# Patient Record
Sex: Male | Born: 1970 | Race: White | Hispanic: No | Marital: Married | Smoking: Current every day smoker
Health system: Southern US, Community
[De-identification: ages and names within clinical notes are randomized; demographics above are authoritative.]

## PROBLEM LIST (undated history)

## (undated) DIAGNOSIS — R51 Headache: Secondary | ICD-10-CM

## (undated) DIAGNOSIS — R519 Headache, unspecified: Secondary | ICD-10-CM

## (undated) HISTORY — DX: Headache, unspecified: R51.9

## (undated) HISTORY — DX: Headache: R51

## (undated) HISTORY — PX: ADENOIDECTOMY: SUR15

---

## 1999-01-08 ENCOUNTER — Encounter: Payer: Self-pay | Admitting: *Deleted

## 1999-01-08 ENCOUNTER — Emergency Department (HOSPITAL_COMMUNITY): Admission: EM | Admit: 1999-01-08 | Discharge: 1999-01-08 | Payer: Self-pay | Admitting: *Deleted

## 1999-04-18 ENCOUNTER — Emergency Department (HOSPITAL_COMMUNITY): Admission: EM | Admit: 1999-04-18 | Discharge: 1999-04-18 | Payer: Self-pay | Admitting: Emergency Medicine

## 2003-03-26 ENCOUNTER — Encounter: Admission: RE | Admit: 2003-03-26 | Discharge: 2003-03-26 | Payer: Self-pay | Admitting: Neurosurgery

## 2003-03-26 ENCOUNTER — Encounter: Payer: Self-pay | Admitting: Neurosurgery

## 2003-05-07 ENCOUNTER — Encounter: Admission: RE | Admit: 2003-05-07 | Discharge: 2003-05-07 | Payer: Self-pay | Admitting: Neurosurgery

## 2003-07-16 ENCOUNTER — Encounter: Admission: RE | Admit: 2003-07-16 | Discharge: 2003-07-16 | Payer: Self-pay | Admitting: Neurosurgery

## 2004-10-08 ENCOUNTER — Emergency Department (HOSPITAL_COMMUNITY): Admission: EM | Admit: 2004-10-08 | Discharge: 2004-10-08 | Payer: Self-pay | Admitting: Emergency Medicine

## 2008-08-09 ENCOUNTER — Emergency Department (HOSPITAL_COMMUNITY): Admission: EM | Admit: 2008-08-09 | Discharge: 2008-08-09 | Payer: Self-pay | Admitting: Emergency Medicine

## 2011-09-03 ENCOUNTER — Ambulatory Visit: Payer: Self-pay | Admitting: Internal Medicine

## 2011-09-03 DIAGNOSIS — H53149 Visual discomfort, unspecified: Secondary | ICD-10-CM

## 2011-09-03 DIAGNOSIS — R110 Nausea: Secondary | ICD-10-CM

## 2011-09-03 DIAGNOSIS — G43909 Migraine, unspecified, not intractable, without status migrainosus: Secondary | ICD-10-CM | POA: Insufficient documentation

## 2011-09-03 DIAGNOSIS — G4700 Insomnia, unspecified: Secondary | ICD-10-CM

## 2011-09-03 DIAGNOSIS — R1110 Vomiting, unspecified: Secondary | ICD-10-CM

## 2011-09-03 DIAGNOSIS — R51 Headache: Secondary | ICD-10-CM

## 2011-09-03 MED ORDER — HYDROCODONE-ACETAMINOPHEN 5-500 MG PO TABS: 1.0000 | ORAL_TABLET | Freq: Three times a day (TID) | ORAL | Status: AC | PRN

## 2011-09-03 MED ORDER — PROMETHAZINE HCL 25 MG PO TABS: 25.0000 mg | ORAL_TABLET | Freq: Three times a day (TID) | ORAL | Status: AC | PRN

## 2011-09-03 MED ORDER — AMITRIPTYLINE HCL 25 MG PO TABS: 25.0000 mg | ORAL_TABLET | Freq: Every day | ORAL | Status: AC

## 2011-09-03 NOTE — Progress Notes (Signed)
  Subjective:    Patient ID: Andrew Butler, male    DOB: [birth year], 41 y.o.   MRN: [id number]  HPI C/O HA off and on for 10d. Phx of migrains, never imaged, w/up done with FP in past. This attacked hit suddenly, had an aura, was photo and phono phobic. Vomited 3x. Lots of family stress, single father of 55 y/o son, working too much, not sleeping well. No focal NMS loss  Review of Systems Smoker DX of previous migrains    Objective:   Physical Exam  Constitutional: He is oriented to person, place, and time. He appears well-developed and well-nourished. No distress.  Eyes: EOM are normal. Pupils are equal, round, and reactive to light.  Neck: Normal range of motion. Neck supple.  Cardiovascular: Normal rate.   Pulmonary/Chest: Effort normal.  Neurological: He is alert and oriented to person, place, and time. He has normal strength and normal reflexes. He is not disoriented. He displays normal reflexes. No cranial nerve deficit or sensory deficit. He displays a negative Romberg sign. Coordination and gait normal. He displays no Babinski's sign on the right side. He displays no Babinski's sign on the left side.        Excellent balance No drift  Fully alert   Skin: Skin is warm and dry.  Psychiatric: He has a normal mood and affect.          Assessment & Plan:   Migrain Vomiting  No insurance but agrees to CT scan if not rapid improvement Keep HA diary  Amitriptyline 25mg  hs Phenergan 25mg  prn Alive 440 prn Vicodin 5/500 prn  Close f/up with FP

## 2011-09-03 NOTE — Patient Instructions (Signed)

## 2012-09-09 ENCOUNTER — Ambulatory Visit (HOSPITAL_BASED_OUTPATIENT_CLINIC_OR_DEPARTMENT_OTHER): Payer: BC Managed Care – PPO

## 2014-07-08 ENCOUNTER — Ambulatory Visit (INDEPENDENT_AMBULATORY_CARE_PROVIDER_SITE_OTHER): Payer: BLUE CROSS/BLUE SHIELD

## 2014-07-08 ENCOUNTER — Ambulatory Visit (INDEPENDENT_AMBULATORY_CARE_PROVIDER_SITE_OTHER): Payer: BLUE CROSS/BLUE SHIELD | Admitting: Family Medicine

## 2014-07-08 VITALS — BP 144/98 | HR 84 | Temp 98.3°F | Resp 18 | Ht 70.0 in | Wt 216.0 lb

## 2014-07-08 DIAGNOSIS — R030 Elevated blood-pressure reading, without diagnosis of hypertension: Secondary | ICD-10-CM

## 2014-07-08 DIAGNOSIS — G44209 Tension-type headache, unspecified, not intractable: Secondary | ICD-10-CM

## 2014-07-08 DIAGNOSIS — Z872 Personal history of diseases of the skin and subcutaneous tissue: Secondary | ICD-10-CM

## 2014-07-08 DIAGNOSIS — F17200 Nicotine dependence, unspecified, uncomplicated: Secondary | ICD-10-CM

## 2014-07-08 DIAGNOSIS — Z720 Tobacco use: Secondary | ICD-10-CM

## 2014-07-08 DIAGNOSIS — R079 Chest pain, unspecified: Secondary | ICD-10-CM

## 2014-07-08 DIAGNOSIS — R5383 Other fatigue: Secondary | ICD-10-CM

## 2014-07-08 LAB — POCT URINALYSIS DIPSTICK
Bilirubin, UA: NEGATIVE
Blood, UA: NEGATIVE
Glucose, UA: NEGATIVE
KETONES UA: NEGATIVE
Leukocytes, UA: NEGATIVE
NITRITE UA: NEGATIVE
PROTEIN UA: NEGATIVE
Spec Grav, UA: >=1.03
Urobilinogen, UA: 0.2
pH, UA: 5.5

## 2014-07-08 MED ORDER — ASPIRIN 81 MG PO TABS: 81.0000 mg | ORAL_TABLET | Freq: Every day | ORAL | Status: DC

## 2014-07-08 MED ORDER — METOPROLOL TARTRATE 25 MG PO TABS: 25.0000 mg | ORAL_TABLET | Freq: Two times a day (BID) | ORAL | Status: DC

## 2014-07-08 MED ORDER — IBUPROFEN 600 MG PO TABS: 600.0000 mg | ORAL_TABLET | Freq: Three times a day (TID) | ORAL | Status: DC | PRN

## 2014-07-08 NOTE — Progress Notes (Signed)
MRN: [id number] DOB: [birth year]  Subjective:   Andrew Butler is a 44 y.o. male presenting for 1 day history of chest pain. Chest pain is sharp in nature, comes in transient waves lasting less than 5 seconds, occuring once every 20 minutes, felt about ~30 times in the last day, does not radiate. Symptoms initially started out with malaise, heart racing, palpitations and intermittent left jaw pain 4 days ago, developed headache 2 days ago on son's birthday, felt in neck radiating toward temporal head bilaterally, has been using Goody powder multiple times a day without any relief. Also admits a several week history of shob with strenuous activity at work, several month history of intermittent dizziness lasting a few seconds. Denies diaphoresis, arm pain, pain in neck, radiation of pain toward back, n/v, abdominal pain. Denies history of cardiovascular diagnoses. Of note, patient admits increased stress and anxiety in the past months (especially this week), does maintenance work Web designer(redoing rental houses), has to meet time demands with work, does not have adequate support, works alone, is very strenuous work. Also of note, girlfriend states that he snores, holds his breath, has nightime awakenings wherein he is gasping for air. Diet is "bad", does not have an exercise routine, states he is active at work. Has had intermittent numbness and tingling for months in both hands and both feet. Admits smoking ~1 ppd 15+ years, would like to quit, drinks 6-8 beers about one a week, used to drink much more. Denies any other aggravating or relieving factors, no other questions or concerns.  Andrew LericheMark has a current medication list which includes the following prescription(s): omeprazole.  He has No Known Allergies.  Andrew LericheMark  has a past medical history of HA (headache). Also  has no past surgical history on file.   Andrew Butler's family history includes Cancer in his father; Diabetes in his mother; Heart disease in his father; Mental  illness in his sister.  ROS As in subjective.  Objective:   Vitals: BP 144/98 mmHg  Pulse 84  Temp(Src) 98.3 F (36.8 C) (Oral)  Resp 18  Ht 5\' 10"  (1.778 m)  Wt 216 lb (97.977 kg)  BMI 30.99 kg/m2  SpO2 97%   Physical Exam  Constitutional: He is oriented to person, place, and time and well-developed, well-nourished, and in no distress.  Eyes: Conjunctivae and EOM are normal. Right eye exhibits no discharge. Left eye exhibits no discharge. No scleral icterus.  Neck: Normal range of motion. No thyromegaly present.  Cardiovascular: Normal rate, regular rhythm, normal heart sounds and intact distal pulses.  Exam reveals no gallop and no friction rub.   No murmur heard. Pulmonary/Chest: Effort normal and breath sounds normal. No stridor. No respiratory distress. He has no wheezes. He has no rales. He exhibits no tenderness.  Abdominal: Soft. Bowel sounds are normal. He exhibits no distension and no mass. There is no tenderness.  Lymphadenopathy:    He has no cervical adenopathy.  Neurological: He is alert and oriented to person, place, and time.  Skin: Skin is warm and dry. No rash noted. No erythema.  Psychiatric: Mood and affect normal.   Results for orders placed or performed in visit on 07/08/14 (from the past 24 hour(s))  POCT urinalysis dipstick     Status: None   Collection Time: 07/08/14  7:17 PM  Result Value Ref Range   Color, UA yellow    Clarity, UA clear    Glucose, UA neg    Bilirubin, UA neg  Ketones, UA neg    Spec Grav, UA >=1.030    Blood, UA neg    pH, UA 5.5    Protein, UA neg    Urobilinogen, UA 0.2    Nitrite, UA neg    Leukocytes, UA Negative    Assessment and Plan :   1. Chest pain, unspecified chest pain type 2. Elevated blood pressure reading without diagnosis of hypertension 3. Tobacco use disorder - Atypical chest pain worrisome for cardiovascular etiology however physical exam, EKG, CXR reassuring. Will refer to Cardiology for further  evaluation, consider stress test, echo. - Start Metoprolol, aspirin daily; consider switching to or adding lisinopril or HCT in the future - Advised to refrain from strenuous activity until evaluation by Cardiology - Smoking cessation highly recommended, counseling provided, patient is interested in quitting - Will start medication possibly Wellbutrin after evaluation with Cardiologist - labs pending   4. Tension-type headache, not intractable, unspecified chronicity pattern - Advised to stop taking goody powders, may take ibuprofen  every 8 hours with food as needed for h/a  5. Other fatigue - Ambulatory referral to Sleep Studies for evaluation of sleep apnea - Will attempt to control BP in the meantime.  6. History of urticaria - Ambulatory referral to Allergy   Andrew Bamberg, PA-C [facility] [phone] 07/08/2014 7:21 PM

## 2014-07-08 NOTE — Patient Instructions (Addendum)
- Please call me and let me know when you've seen the Cardiologist. I will see you for follow up thereafter. - Start taking baby aspirin 81mg  daily - Day 1-2 take 1/2 of 25mg  tablet of Metoprolol twice daily - Day 3, start taking 1 tablet of 25mg  of Metoprolol twice daily - For your headache, you may take ibuprofen 600mg  with food every 8 hours as needed   Chest Pain (Nonspecific) It is often hard to give a specific diagnosis for the cause of chest pain. There is always a chance that your pain could be related to something serious, such as a heart attack or a blood clot in the lungs. You need to follow up with your health care provider for further evaluation. CAUSES   Heartburn.  Pneumonia or bronchitis.  Anxiety or stress.  Inflammation around your heart (pericarditis) or lung (pleuritis or pleurisy).  A blood clot in the lung.  A collapsed lung (pneumothorax). It can develop suddenly on its own (spontaneous pneumothorax) or from trauma to the chest.  Shingles infection (herpes zoster virus). The chest wall is composed of bones, muscles, and cartilage. Any of these can be the source of the pain.  The bones can be bruised by injury.  The muscles or cartilage can be strained by coughing or overwork.  The cartilage can be affected by inflammation and become sore (costochondritis). DIAGNOSIS  Lab tests or other studies may be needed to find the cause of your pain. Your health care provider may have you take a test called an ambulatory electrocardiogram (ECG). An ECG records your heartbeat patterns over a 24-hour period. You may also have other tests, such as:  Transthoracic echocardiogram (TTE). During echocardiography, sound waves are used to evaluate how blood flows through your heart.  Transesophageal echocardiogram (TEE).  Cardiac monitoring. This allows your health care provider to monitor your heart rate and rhythm in real time.  Holter monitor. This is a portable device  that records your heartbeat and can help diagnose heart arrhythmias. It allows your health care provider to track your heart activity for several days, if needed.  Stress tests by exercise or by giving medicine that makes the heart beat faster. TREATMENT   Treatment depends on what may be causing your chest pain. Treatment may include:  Acid blockers for heartburn.  Anti-inflammatory medicine.  Pain medicine for inflammatory conditions.  Antibiotics if an infection is present.  You may be advised to change lifestyle habits. This includes stopping smoking and avoiding alcohol, caffeine, and chocolate.  You may be advised to keep your head raised (elevated) when sleeping. This reduces the chance of acid going backward from your stomach into your esophagus. Most of the time, nonspecific chest pain will improve within 2-3 days with rest and mild pain medicine.  HOME CARE INSTRUCTIONS   If antibiotics were prescribed, take them as directed. Finish them even if you start to feel better.  For the next few days, avoid physical activities that bring on chest pain. Continue physical activities as directed.  Do not use any tobacco products, including cigarettes, chewing tobacco, or electronic cigarettes.  Avoid drinking alcohol.  Only take medicine as directed by your health care provider.  Follow your health care provider's suggestions for further testing if your chest pain does not go away.  Keep any follow-up appointments you made. If you do not go to an appointment, you could develop lasting (chronic) problems with pain. If there is any problem keeping an appointment, call to  reschedule. SEEK MEDICAL CARE IF:   Your chest pain does not go away, even after treatment.  You have a rash with blisters on your chest.  You have a fever. SEEK IMMEDIATE MEDICAL CARE IF:   You have increased chest pain or pain that spreads to your arm, neck, jaw, back, or abdomen.  You have shortness of  breath.  You have an increasing cough, or you cough up blood.  You have severe back or abdominal pain.  You feel nauseous or vomit.  You have severe weakness.  You faint.  You have chills. This is an emergency. Do not wait to see if the pain will go away. Get medical help at once. Call your local emergency services (911 in [place] Do not drive yourself to the hospital. MAKE SURE YOU:   Understand these instructions.  Will watch your condition.  Will get help right away if you are not doing well or get worse. Document Released: 03/22/2005 Document Revised: 06/17/2013 Document Reviewed: 01/16/2008 [facility] Patient Information 2015 [place], [place]. This information is not intended to replace advice given to you by your health care provider. Make sure you discuss any questions you have with your health care provider.   Angina Pectoris Angina pectoris, often just called angina, is extreme discomfort in your chest, neck, or arm caused by a lack of blood in the middle and thickest layer of your heart wall (myocardium). It may feel like tightness or heavy pressure. It may feel like a crushing or squeezing pain. Some people say it feels like gas or indigestion. It may go down your shoulders, back, and arms. Some people may have symptoms other than pain. These symptoms include fatigue, shortness of breath, cold sweats, or nausea. There are four different types of angina:  Stable angina--Stable angina usually occurs in episodes of predictable frequency and duration. It usually is brought on by physical activity, emotional stress, or excitement. These are all times when the myocardium needs more oxygen. Stable angina usually lasts a few minutes and often is relieved by taking a medicine that can be taken under your tongue (sublingually). The medicine is called nitroglycerin. Stable angina is caused by a buildup of plaque inside the arteries, which restricts blood flow to the heart muscle  (atherosclerosis).  Unstable angina--Unstable angina can occur even when your body experiences little or no physical exertion. It can occur during sleep. It can also occur at rest. It can suddenly increase in severity or frequency. It might not be relieved by sublingual nitroglycerin. It can last up to 30 minutes. The most common cause of unstable angina is a blood clot that has developed on the top of plaque buildup inside a coronary artery. It can lead to a heart attack if the blood clot completely blocks the artery.  Microvascular angina--This type of angina is caused by a disorder of tiny blood vessels called arterioles. Microvascular angina is more common in women. The pain may be more severe and last longer than other types of angina pectoris.  Prinzmetal or variant angina--This type of angina pectoris usually occurs when your body experiences little or no physical exertion. It especially occurs in the early morning hours. It is caused by a spasm of your coronary artery. HOME CARE INSTRUCTIONS   Only take over-the-counter and prescription medicines as directed by your health care provider.  Stay active or increase your exercise as directed by your health care provider.  Limit strenuous activity as directed by your health care provider.  Limit  heavy lifting as directed by your health care provider.  Maintain a healthy weight.  Learn about and eat heart-healthy foods.  Do not use any tobacco products including cigarettes, chewing tobacco or electronic cigarettes. SEEK IMMEDIATE MEDICAL CARE IF:  You experience the following symptoms:  Chest, neck, deep shoulder, or arm pain or discomfort that lasts more than a few minutes.  Chest, neck, deep shoulder, or arm pain or discomfort that goes away and comes back, repeatedly.  Heavy sweating with discomfort, without a noticeable cause.  Shortness of breath or difficulty breathing.  Angina that does not get better after a few minutes of  rest or after taking sublingual nitroglycerin. These can all be symptoms of a heart attack, which is a medical emergency! Get medical help at once. Call your local emergency service (911 in [place] immediately. Do not  drive yourself to the hospital and do not  wait to for your symptoms to go away. MAKE SURE YOU:  Understand these instructions.  Will watch your condition.  Will get help right away if you are not doing well or get worse. Document Released: 06/12/2005 Document Revised: 06/17/2013 Document Reviewed: 10/14/2013 [facility] Patient Information 2015 [place], [place]. This information is not intended to replace advice given to you by your health care provider. Make sure you discuss any questions you have with your health care provider.

## 2014-07-09 ENCOUNTER — Encounter: Payer: Self-pay | Admitting: Urgent Care

## 2014-07-09 LAB — COMPREHENSIVE METABOLIC PANEL
ALBUMIN: 3.9 g/dL (ref 3.5–5.2)
ALT: 29 U/L (ref 0–53)
AST: 23 U/L (ref 0–37)
Alkaline Phosphatase: 68 U/L (ref 39–117)
BILIRUBIN TOTAL: 0.2 mg/dL (ref 0.2–1.2)
BUN: 13 mg/dL (ref 6–23)
CALCIUM: 8.7 mg/dL (ref 8.4–10.5)
CO2: 26 meq/L (ref 19–32)
Chloride: 107 mEq/L (ref 96–112)
Creat: 1.17 mg/dL (ref 0.50–1.35)
GLUCOSE: 88 mg/dL (ref 70–99)
POTASSIUM: 4.3 meq/L (ref 3.5–5.3)
SODIUM: 140 meq/L (ref 135–145)
Total Protein: 6.5 g/dL (ref 6.0–8.3)

## 2014-07-09 LAB — CBC
HEMATOCRIT: 40.8 % (ref 39.0–52.0)
HEMOGLOBIN: 14.1 g/dL (ref 13.0–17.0)
MCH: 29.8 pg (ref 26.0–34.0)
MCHC: 34.6 g/dL (ref 30.0–36.0)
MCV: 86.3 fL (ref 78.0–100.0)
MPV: 10.8 fL (ref 8.6–12.4)
PLATELETS: 209 10*3/uL (ref 150–400)
RBC: 4.73 MIL/uL (ref 4.22–5.81)
RDW: 13.9 % (ref 11.5–15.5)
WBC: 7.7 10*3/uL (ref 4.0–10.5)

## 2014-07-09 LAB — LIPID PANEL
CHOL/HDL RATIO: 6.4 ratio
CHOLESTEROL: 218 mg/dL — AB (ref 0–200)
HDL: 34 mg/dL — AB (ref >39)
LDL CALC: 136 mg/dL — AB (ref 0–99)
TRIGLYCERIDES: 239 mg/dL — AB (ref <150)
VLDL: 48 mg/dL — ABNORMAL HIGH (ref 0–40)

## 2014-07-09 LAB — TSH: TSH: 2.02 u[IU]/mL (ref 0.350–4.500)

## 2014-07-10 LAB — HEMOGLOBIN A1C
Hgb A1c MFr Bld: 5.9 % — ABNORMAL HIGH (ref <5.7)
Mean Plasma Glucose: 123 mg/dL — ABNORMAL HIGH (ref <117)

## 2014-07-23 NOTE — Progress Notes (Signed)
Xray read and patient discussed with Mr. Mani. Agree with assessment and plan of care per his note.   

## 2014-07-28 ENCOUNTER — Encounter: Payer: Self-pay | Admitting: Urgent Care

## 2014-08-05 ENCOUNTER — Institutional Professional Consult (permissible substitution): Payer: BLUE CROSS/BLUE SHIELD | Admitting: Neurology

## 2014-08-06 ENCOUNTER — Encounter: Payer: Self-pay | Admitting: Neurology

## 2014-08-18 NOTE — Progress Notes (Signed)
Andrew Butler is a 44 y/o male referred to Dr. Jacinto HalimGanji for further evaluation of chest pain, shob, elevated blood pressure. He was seen on 07/15/2014. Patient was diagnosed with elevated bp (not HTN), OSA, mixed hyperlipidemia, elevated blood sugar, tobacco use disorder. He was sent to Dr. Vickey Hugerohmeier for sleep study. Started on Chantix for smoking cessation. Recommended to hold off on metoprolol, continue 81mg  aspirin daily, start dietary modifications, stress test pending. Referral report to be scanned.  [name], PA-C [facility] [phone] 08/18/2014  12:29 PM

## 2016-04-04 ENCOUNTER — Encounter: Payer: Self-pay | Admitting: Allergy and Immunology

## 2016-04-04 ENCOUNTER — Ambulatory Visit (INDEPENDENT_AMBULATORY_CARE_PROVIDER_SITE_OTHER): Payer: BLUE CROSS/BLUE SHIELD | Admitting: Allergy and Immunology

## 2016-04-04 VITALS — BP 138/88 | HR 72 | Temp 98.1°F | Resp 16 | Ht 70.0 in | Wt 217.0 lb

## 2016-04-04 DIAGNOSIS — Z720 Tobacco use: Secondary | ICD-10-CM | POA: Diagnosis not present

## 2016-04-04 DIAGNOSIS — J3089 Other allergic rhinitis: Secondary | ICD-10-CM

## 2016-04-04 DIAGNOSIS — G4733 Obstructive sleep apnea (adult) (pediatric): Secondary | ICD-10-CM

## 2016-04-04 DIAGNOSIS — J310 Chronic rhinitis: Secondary | ICD-10-CM | POA: Diagnosis not present

## 2016-04-04 DIAGNOSIS — T485X5A Adverse effect of other anti-common-cold drugs, initial encounter: Secondary | ICD-10-CM

## 2016-04-04 DIAGNOSIS — R51 Headache: Secondary | ICD-10-CM

## 2016-04-04 DIAGNOSIS — R519 Headache, unspecified: Secondary | ICD-10-CM

## 2016-04-04 DIAGNOSIS — Z91018 Allergy to other foods: Secondary | ICD-10-CM | POA: Diagnosis not present

## 2016-04-04 DIAGNOSIS — T485X1A Poisoning by other anti-common-cold drugs, accidental (unintentional), initial encounter: Secondary | ICD-10-CM | POA: Diagnosis not present

## 2016-04-04 MED ORDER — EPINEPHRINE 0.3 MG/0.3ML IJ SOAJ
0.3000 mg | Freq: Once | INTRAMUSCULAR | 2 refills | Status: AC
Start: 2016-04-04 — End: 2016-04-04

## 2016-04-04 MED ORDER — MONTELUKAST SODIUM 10 MG PO TABS: 10.0000 mg | ORAL_TABLET | Freq: Every day | ORAL | 5 refills | Status: AC

## 2016-04-04 NOTE — Progress Notes (Signed)
NEW PATIENT NOTE  Referring Provider: No ref. provider found Primary Provider: Kaleen Mask, MD Date of office visit: 04/04/2016    Subjective:   Chief Complaint:  Andrew Butler (DOB: 06-Apr-1971) is a 45 y.o. male who presents to the clinic on 04/04/2016 with a chief complaint of Allergic Reaction and Urticaria .  HPI: [name] presents to this clinic in evaluation of allergic reactions. He states that 2 years ago at Thanksgiving after eating [place] he developed diffuse urticaria and itchy hands and feet and then 3 months later after eating chicken wings he developed rather significant throat closing in association with itchy hands and feet. Since that point in time he has had reactions about every 3-4 weeks which appear to be associated with the consumption of dark chicken meat. He will develop palm and feet itching within 15 minutes of eating these foods and he will take a Benadryl and it will resolve within an hour. All these reactions have occurred after eating poultry. However, they are intermittent in nature. He has not noticed any other obvious provoking factor giving rise to this issue and he does not have any associated systemic or constitutional symptoms.  [name] also has an issue with nasal congestion at nighttime. He uses Afrin every night and lately has started to use it twice a day to keep his nose open. He uses Afrin at night time to help with his sleep. He will obstruct his airway at nighttime and wake up at night and feel exhausted in the morning and Afrin appears to have helped him significantly. He does wake up every morning with a headache. He can drive without any problem and does not take a nap on most occasions.  [name] has headaches. He has a daily headache especially in the morning. He will take Goody powder every morning. He drinks at least 3-4 [facility] per day and drinks tea as well.  [name] smokes tobacco for the past 30 years at least 1 pack per day.  Past  Medical History:  Diagnosis Date  . HA (headache)     Past Surgical History:  Procedure Laterality Date  . ADENOIDECTOMY        Medication List      diphenhydrAMINE 25 MG tablet Commonly known as:  SOMINEX Take 50 mg by mouth as needed for sleep.   omeprazole 20 MG capsule Commonly known as:  PRILOSEC Take 20 mg by mouth daily.       No Known Allergies  Review of systems negative except as noted in HPI / PMHx or noted below:  Review of Systems  Constitutional: Negative.   HENT: Negative.   Eyes: Negative.   Respiratory: Negative.   Cardiovascular: Negative.   Gastrointestinal: Negative.   Genitourinary: Negative.   Musculoskeletal: Negative.   Skin: Negative.   Neurological: Negative.   Endo/Heme/Allergies: Negative.   Psychiatric/Behavioral: Negative.     Family History  Problem Relation Age of Onset  . Diabetes Mother   . Hypertension Mother   . Cancer Father   . Heart disease Father     hx of MI at 62, 2 stents  . Mental illness Sister     Social History   Social History  . Marital status: Married    Spouse name: N/A  . Number of children: N/A  . Years of education: N/A   Occupational History  . Not on file.   Social History Main Topics  . Smoking status: Current Every Day Smoker  Packs/day: 1.00  . Smokeless tobacco: Never Used  . Alcohol use Not on file  . Drug use: Unknown  . Sexual activity: Not on file   Other Topics Concern  . Not on file   Social History Narrative  . No narrative on file    Environmental and Social history  Lives in a house with a dry environment, dogs located inside the household, carpeting in the bedroom, plastic on the bed but not the pillow, and actually smoking tobacco products. 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  Objective:   Vitals:   04/04/16 0833  BP: 138/88  Pulse: 72  Resp: 16  Temp: 98.1 F (36.7 C)   Height: 5\' 10"  (177.8  cm) Weight: 217 lb (98.4 kg)  Physical Exam  Constitutional: He is well-developed, well-nourished, and in no distress.  HENT:  Head: Normocephalic. Head is without right periorbital erythema and without left periorbital erythema.  Right Ear: Tympanic membrane, external ear and ear canal normal.  Left Ear: Tympanic membrane, external ear and ear canal normal.  Nose: Nose normal. No mucosal edema or rhinorrhea.  Mouth/Throat: Uvula is midline, oropharynx is clear and moist and mucous membranes are normal. No oropharyngeal exudate.  Eyes: Conjunctivae and lids are normal. Pupils are equal, round, and reactive to light.  Neck: Trachea normal. No tracheal tenderness present. No tracheal deviation present. No thyromegaly present.  Cardiovascular: Normal rate, regular rhythm, S1 normal, S2 normal and normal heart sounds.   No murmur heard. Pulmonary/Chest: Effort normal and breath sounds normal. No stridor. No tachypnea. No respiratory distress. He has no wheezes. He has no rales. He exhibits no tenderness.  Abdominal: Soft. He exhibits no distension and no mass. There is no hepatosplenomegaly. There is no tenderness. There is no rebound and no guarding.  Musculoskeletal: He exhibits no edema or tenderness.  Lymphadenopathy:       Head (right side): No tonsillar adenopathy present.       Head (left side): No tonsillar adenopathy present.    He has no cervical adenopathy.    He has no axillary adenopathy.  Neurological: He is alert. Gait normal.  Skin: No rash noted. He is not diaphoretic. No erythema. No pallor. Nails show no clubbing.  Psychiatric: Mood and affect normal.    Diagnostics: Allergy skin tests were performed. He demonstrated hypersensitivity against grasses and house dust mite. He did not demonstrate any hypersensitivity against foods.  Assessment and Plan:    1. Food allergy   2. Other allergic rhinitis   3. Rhinitis medicamentosa   4. Headache disorder   5. Obstructive  sleep apnea syndrome   6. Tobacco use     1. Allergen avoidance measures  2. Auvi-Q 3.0, Benadryl, M.D./ER for allergic reaction (pharmacy program)  3. Use a combination of OTC Afrin 1 spray each nostril and OTC Rhinocort one spray each nostril before bedtime.  4. Start montelukast 10 mg one tablet one time per day  5. Start loratadine 10 mg one tablet one time per day  6. Slowly taper down all caffeine consumption aiming for least amount possible over the next 4 weeks  7. Find nicotine substitute to replace tobacco smoke exposure  8. Obtain fall flu vaccine  9. Blood - chicken IgE, [place] IgE, alpha gal panel  10. Further evaluation? Sleep apnea evaluation?  11. Return to clinic in 4 weeks or earlier if problem  [name] appears to have 3 distinct issues  ongoing at this point in time that were addressed during today's visit. He has had some type of recurrent immunological hyperreactivity that he believes is tied up with consumption of poultry and we will further investigate this issue by checking IgE specific antibodies against these food substances. In addition, I will check him for alpha gal syndrome. I've given him a injectable epinephrine device should he ever have a bad reaction and I would like for him to use montelukast and loratadine as a preventative form of therapy for this issue. The other problem appears to be a airway that is swollen and hopefully the combination of montelukast and loratadine and the consistent use of some nasal steroid will result in improvement regarding this issue and will result in better respiratory dynamics while he sleeps and will alleviate what appears to be a history consistent with sleep apnea. For now we will keep him on a combination of Afrin and Rhinocort with the understanding that the nasal steroid should prevent him from developing significant rebound commonly seen with topical nasal decongestant sprays. Finally, he does appear to have chronic  daily headache which is mostly tied up with his very extensive caffeine consumption and he does smoke tobacco which I attempted to address both issues during today's evaluation. I will see him back in this clinic in 4 weeks or earlier if there is a problem.  Laurette Schimke, MD [facility]

## 2016-04-04 NOTE — Patient Instructions (Addendum)
  1. Allergen avoidance measures  2. Auvi-Q 3.0, Benadryl, M.D./ER for allergic reaction (pharmacy program)  3. Use a combination of OTC Afrin 1 spray each nostril and OTC Rhinocort one spray each nostril before bedtime.  4. Start montelukast 10 mg one tablet one time per day  5. Start loratadine 10 mg one tablet one time per day  6. Slowly taper down all caffeine consumption aiming for least amount possible over the next 4 weeks  7. Find nicotine substitute to replace tobacco smoke exposure  8. Obtain fall flu vaccine  9. Blood - chicken IgE, [place] IgE, alpha gal panel  10. Further evaluation? Sleep apnea evaluation?  11. Return to clinic in 4 weeks or earlier if problem

## 2016-05-02 ENCOUNTER — Ambulatory Visit: Payer: BLUE CROSS/BLUE SHIELD | Admitting: Allergy and Immunology

## 2016-08-10 ENCOUNTER — Other Ambulatory Visit: Payer: Self-pay | Admitting: Family Medicine

## 2016-08-10 DIAGNOSIS — G44009 Cluster headache syndrome, unspecified, not intractable: Secondary | ICD-10-CM

## 2016-08-11 ENCOUNTER — Ambulatory Visit
Admission: RE | Admit: 2016-08-11 | Discharge: 2016-08-11 | Disposition: A | Discharge status: Home / Self Care | Payer: BLUE CROSS/BLUE SHIELD | Source: Ambulatory Visit | Attending: Family Medicine | Admitting: Family Medicine

## 2016-08-11 DIAGNOSIS — G44009 Cluster headache syndrome, unspecified, not intractable: Secondary | ICD-10-CM

## 2016-09-12 ENCOUNTER — Ambulatory Visit: Payer: BLUE CROSS/BLUE SHIELD | Admitting: Neurology

## 2016-09-19 ENCOUNTER — Encounter: Payer: Self-pay | Admitting: Neurology

## 2018-07-24 NOTE — Progress Notes (Deleted)
   Subjective:    Patient ID: Andrew Butler, male    DOB: [birth year], 48 y.o.   MRN: [id number]  HPI:  Mr. Soderberg is here to establish as a new pt.  He is a pleasant 48 year old male. FOY:DXAJ,   Patient Care Team    Relationship Specialty Notifications Start End  Kaleen Mask, MD PCP - General Family Medicine  04/04/16   Kaleen Mask, MD Referring Physician Family Medicine  08/21/12     Patient Active Problem List   Diagnosis Date Noted  . Migraine 09/03/2011     Past Medical History:  Diagnosis Date  . HA (headache)      Past Surgical History:  Procedure Laterality Date  . ADENOIDECTOMY       Family History  Problem Relation Age of Onset  . Diabetes Mother   . Hypertension Mother   . Cancer Father   . Heart disease Father        hx of MI at 13, 2 stents  . Mental illness Sister      Social History   Substance and Sexual Activity  Drug Use Not on file     Social History   Substance and Sexual Activity  Alcohol Use Not on file     Social History   Tobacco Use  Smoking Status Current Every Day Smoker  . Packs/day: 1.00  Smokeless Tobacco Never Used     Outpatient Encounter Medications as of 07/30/2018  Medication Sig  . diphenhydrAMINE (SOMINEX) 25 MG tablet Take 50 mg by mouth as needed for sleep.  . montelukast (SINGULAIR) 10 MG tablet Take 1 tablet (10 mg total) by mouth at bedtime.  Marland Kitchen omeprazole (PRILOSEC) 20 MG capsule Take 20 mg by mouth daily.   No facility-administered encounter medications on file as of 07/30/2018.     Allergies: Patient has no known allergies.  There is no height or weight on file to calculate BMI.  There were no vitals taken for this visit.     Review of Systems     Objective:   Physical Exam        Assessment & Plan:  No diagnosis found.  No problem-specific Assessment & Plan notes found for this encounter.    FOLLOW-UP:  No follow-ups on file.

## 2018-07-25 ENCOUNTER — Ambulatory Visit: Payer: BLUE CROSS/BLUE SHIELD | Admitting: Adult Health

## 2018-07-30 ENCOUNTER — Ambulatory Visit: Payer: BLUE CROSS/BLUE SHIELD | Admitting: Adult Health

## 2018-12-12 ENCOUNTER — Encounter: Payer: Self-pay | Admitting: Gastroenterology

## 2019-08-26 ENCOUNTER — Ambulatory Visit: Payer: Self-pay | Attending: Internal Medicine

## 2019-08-26 DIAGNOSIS — Z23 Encounter for immunization: Secondary | ICD-10-CM | POA: Insufficient documentation

## 2019-08-26 NOTE — Progress Notes (Signed)
   Covid-19 Vaccination Clinic  Name:  KARINA LENDERMAN    MRN: [id number] DOB: 1970/10/19  08/26/2019  Mr. Saffo was observed post Covid-19 immunization for 15 minutes without incident. He was provided with Vaccine Information Sheet and instruction to access the V-Safe system.   Mr. Iwanicki was instructed to call 911 with any severe reactions post vaccine: Marland Kitchen Difficulty breathing  . Swelling of face and throat  . A fast heartbeat  . A bad rash all over body  . Dizziness and weakness   Immunizations Administered    Name Date Dose VIS Date Route   Pfizer COVID-19 Vaccine 08/26/2019  5:13 PM 0.3 mL 06/06/2019 Intramuscular   Manufacturer: [employer], [employer]   Lot: [id number]   NDC: [address]-2

## 2019-09-23 ENCOUNTER — Ambulatory Visit: Payer: Self-pay | Attending: Internal Medicine

## 2019-09-23 DIAGNOSIS — Z23 Encounter for immunization: Secondary | ICD-10-CM

## 2019-09-23 NOTE — Progress Notes (Signed)
   Covid-19 Vaccination Clinic  Name:  RYLYNN SCHONEMAN    MRN: [id number] DOB: August 27, 1970  09/23/2019  Mr. Marmol was observed post Covid-19 immunization for 15 minutes without incident. He was provided with Vaccine Information Sheet and instruction to access the V-Safe system.   Mr. Casher was instructed to call 911 with any severe reactions post vaccine: Marland Kitchen Difficulty breathing  . Swelling of face and throat  . A fast heartbeat  . A bad rash all over body  . Dizziness and weakness   Immunizations Administered    Name Date Dose VIS Date Route   Pfizer COVID-19 Vaccine 09/23/2019  4:31 PM 0.3 mL 06/06/2019 Intramuscular   Manufacturer: [employer], [employer]   Lot: [id number]   NDC: [address]-8

## 2020-11-25 ENCOUNTER — Other Ambulatory Visit: Payer: Self-pay

## 2020-11-25 ENCOUNTER — Emergency Department (HOSPITAL_COMMUNITY): Payer: Self-pay

## 2020-11-25 ENCOUNTER — Emergency Department (HOSPITAL_COMMUNITY)
Admission: EM | Admit: 2020-11-25 | Discharge: 2020-11-25 | Disposition: A | Discharge status: Home / Self Care | Payer: Self-pay | Attending: Emergency Medicine | Admitting: Emergency Medicine

## 2020-11-25 ENCOUNTER — Encounter (HOSPITAL_COMMUNITY): Payer: Self-pay

## 2020-11-25 DIAGNOSIS — Z23 Encounter for immunization: Secondary | ICD-10-CM | POA: Insufficient documentation

## 2020-11-25 DIAGNOSIS — W458XXA Other foreign body or object entering through skin, initial encounter: Secondary | ICD-10-CM | POA: Insufficient documentation

## 2020-11-25 DIAGNOSIS — F17200 Nicotine dependence, unspecified, uncomplicated: Secondary | ICD-10-CM | POA: Insufficient documentation

## 2020-11-25 DIAGNOSIS — M795 Residual foreign body in soft tissue: Secondary | ICD-10-CM

## 2020-11-25 DIAGNOSIS — S50852A Superficial foreign body of left forearm, initial encounter: Secondary | ICD-10-CM | POA: Insufficient documentation

## 2020-11-25 MED ORDER — TETANUS-DIPHTH-ACELL PERTUSSIS 5-2.5-18.5 LF-MCG/0.5 IM SUSY
0.5000 mL | PREFILLED_SYRINGE | Freq: Once | INTRAMUSCULAR | Status: AC
  Administered 2020-11-25: 0.5 mL via INTRAMUSCULAR
  Filled 2020-11-25: qty 0.5

## 2020-11-25 MED ORDER — IBUPROFEN 800 MG PO TABS
800.0000 mg | ORAL_TABLET | Freq: Once | ORAL | Status: AC
  Administered 2020-11-25: 800 mg via ORAL
  Filled 2020-11-25: qty 1

## 2020-11-25 MED ORDER — LIDOCAINE-EPINEPHRINE 2 %-1:100000 IJ SOLN
20.0000 mL | Freq: Once | INTRAMUSCULAR | Status: AC
  Administered 2020-11-25: 20 mL
  Filled 2020-11-25: qty 1

## 2020-11-25 MED ORDER — CEPHALEXIN 500 MG PO CAPS: ORAL_CAPSULE | ORAL | 0 refills | Status: AC

## 2020-11-25 NOTE — ED Notes (Signed)
Pt discharged from this ED in stable condition at this time. All discharge instructions and follow up care reviewed with pt with no further questions at this time. Pt ambulatory with steady gait, clear speech.  

## 2020-11-25 NOTE — ED Triage Notes (Signed)
Pt arrived via POV, hit arm off of piece of wood, piece broke off inside left forearm.

## 2020-11-25 NOTE — ED Provider Notes (Signed)
[facility]-EMERGENCY DEPT Provider Note   CSN: [id number] Arrival date & time: 11/25/20  1210     History Chief Complaint  Patient presents with  . Foreign Body in Skin    Andrew Butler is a 50 y.o. male.  The history is provided by the patient. No language interpreter was used.     Patient report he was carrying house yard debris, and while he was trying to throw it on top of the trunk of his truck, he accidentally jammed his forearm into a wooden plank.  Incident happened approximately 2 hours ago.  He reported cute onset of sharp pain and also noted a large wood splinter embedded into his left forearm on his nondominant hand.  Pain is sharp moderate in intensity without any associated numbness.  He was unable to remove the splinter prompting this ER visit.  He denies any other injury.  Past Medical History:  Diagnosis Date  . HA (headache)     Patient Active Problem List   Diagnosis Date Noted  . Migraine 09/03/2011    Past Surgical History:  Procedure Laterality Date  . ADENOIDECTOMY         Family History  Problem Relation Age of Onset  . Diabetes Mother   . Hypertension Mother   . Cancer Father   . Heart disease Father        hx of MI at 34, 2 stents  . Mental illness Sister     Social History   Tobacco Use  . Smoking status: Current Every Day Smoker    Packs/day: 1.00  . Smokeless tobacco: Never Used    Home Medications Prior to Admission medications   Medication Sig Start Date End Date Taking? Authorizing Provider  diphenhydrAMINE (SOMINEX) 25 MG tablet Take 50 mg by mouth as needed for sleep.    [provider]  montelukast (SINGULAIR) 10 MG tablet Take 1 tablet (10 mg total) by mouth at bedtime. 04/04/16   Kozlow, Alvira Philips, MD  omeprazole (PRILOSEC) 20 MG capsule Take 20 mg by mouth daily.    [provider]    Allergies    Patient has no known allergies.  Review of Systems   Review of Systems   Constitutional: Negative for fever.  Skin: Positive for wound.  Neurological: Negative for numbness.    Physical Exam Updated Vital Signs BP 139/87 (BP Location: Left Arm)   Pulse 76   Temp 99 F (37.2 C) (Oral)   Resp 20   SpO2 100%   Physical Exam Vitals and nursing note reviewed.  Constitutional:      General: He is not in acute distress.    Appearance: He is well-developed.  HENT:     Head: Atraumatic.  Eyes:     Conjunctiva/sclera: Conjunctivae normal.  Musculoskeletal:     Cervical back: Neck supple.  Skin:    Findings: No rash.     Comments: Left forearm: There is an embedded wood splinter approximately 4 cm in diameter noted to the subcutaneous region of the mid forearm with tenderness to palpation.  Sensation is intact distally and patient able to move all fingers with full range of motion.  Neurological:     Mental Status: He is alert.     ED Results / Procedures / Treatments   Labs (all labs ordered are listed, but only abnormal results are displayed) Labs Reviewed - No data to display  EKG None  Radiology No results found.  Procedures .Foreign  Body Removal  Date/Time: 11/25/2020 2:29 PM Performed by: Fayrene Helper, PA-C Authorized by: Fayrene Helper, PA-C  Consent: Verbal consent obtained. Risks and benefits: risks, benefits and alternatives were discussed Consent given by: patient Patient understanding: patient states understanding of the procedure being performed Patient consent: the patient's understanding of the procedure matches consent given Procedure consent: procedure consent matches procedure scheduled Patient identity confirmed: verbally with patient and hospital-assigned identification number Time out: Immediately prior to procedure a "time out" was called to verify the correct patient, procedure, equipment, support staff and site/side marked as required. Body area: skin General location: upper extremity Location details: left  forearm Anesthesia: local infiltration  Anesthesia: Local Anesthetic: lidocaine 1% with epinephrine Anesthetic total: 3 mL  Sedation: Patient sedated: no  Patient restrained: no Patient cooperative: yes Localization method: probed Removal mechanism: forceps, scalpel and hemostat Dressing: dressing applied Tendon involvement: none Depth: subcutaneous Complexity: simple 1 objects recovered. Objects recovered: 4cm wood splinter Post-procedure assessment: foreign body removed Patient tolerance: patient tolerated the procedure well with no immediate complications     Medications Ordered in ED Medications  Tdap (BOOSTRIX) injection 0.5 mL (has no administration in time range)  lidocaine-EPINEPHrine (XYLOCAINE W/EPI) 2 %-1:100000 (with pres) injection 20 mL (has no administration in time range)  ibuprofen (ADVIL) tablet 800 mg (has no administration in time range)    ED Course  I have reviewed the triage vital signs and the nursing notes.  Pertinent labs & imaging results that were available during my care of the patient were reviewed by me and considered in my medical decision making (see chart for details).    MDM Rules/Calculators/A&P                          BP 139/87 (BP Location: Left Arm)   Pulse 76   Temp 99 F (37.2 C) (Oral)   Resp 20   SpO2 100%   Final Clinical Impression(s) / ED Diagnoses Final diagnoses:  Foreign body (FB) in soft tissue    Rx / DC Orders ED Discharge Orders         Ordered    cephALEXin (KEFLEX) 500 MG capsule        11/25/20 1433         1:26 PM Patient had a 4 cm wood splinter embedded into his forearm that will need to be removed.  2:31 PM Successful removal of 4cm wooden splinter, pt tolerates well.  Will placed on abx and wound care instruction provided.  Return precaution discussed.  Care discussed with Dr. Myrtis Ser.    Fayrene Helper, PA-C 11/25/20 1433    Sabino Donovan, MD 11/25/20 [phone]

## 2022-02-22 IMAGING — CR DG FOREARM 2V*L*
2 series · 2 of 2 positions shown · non-contrast
Comparison: None.

CLINICAL DATA: Pain following laceration

EXAM:
LEFT FOREARM - 2 VIEW

[x forearm ap left]
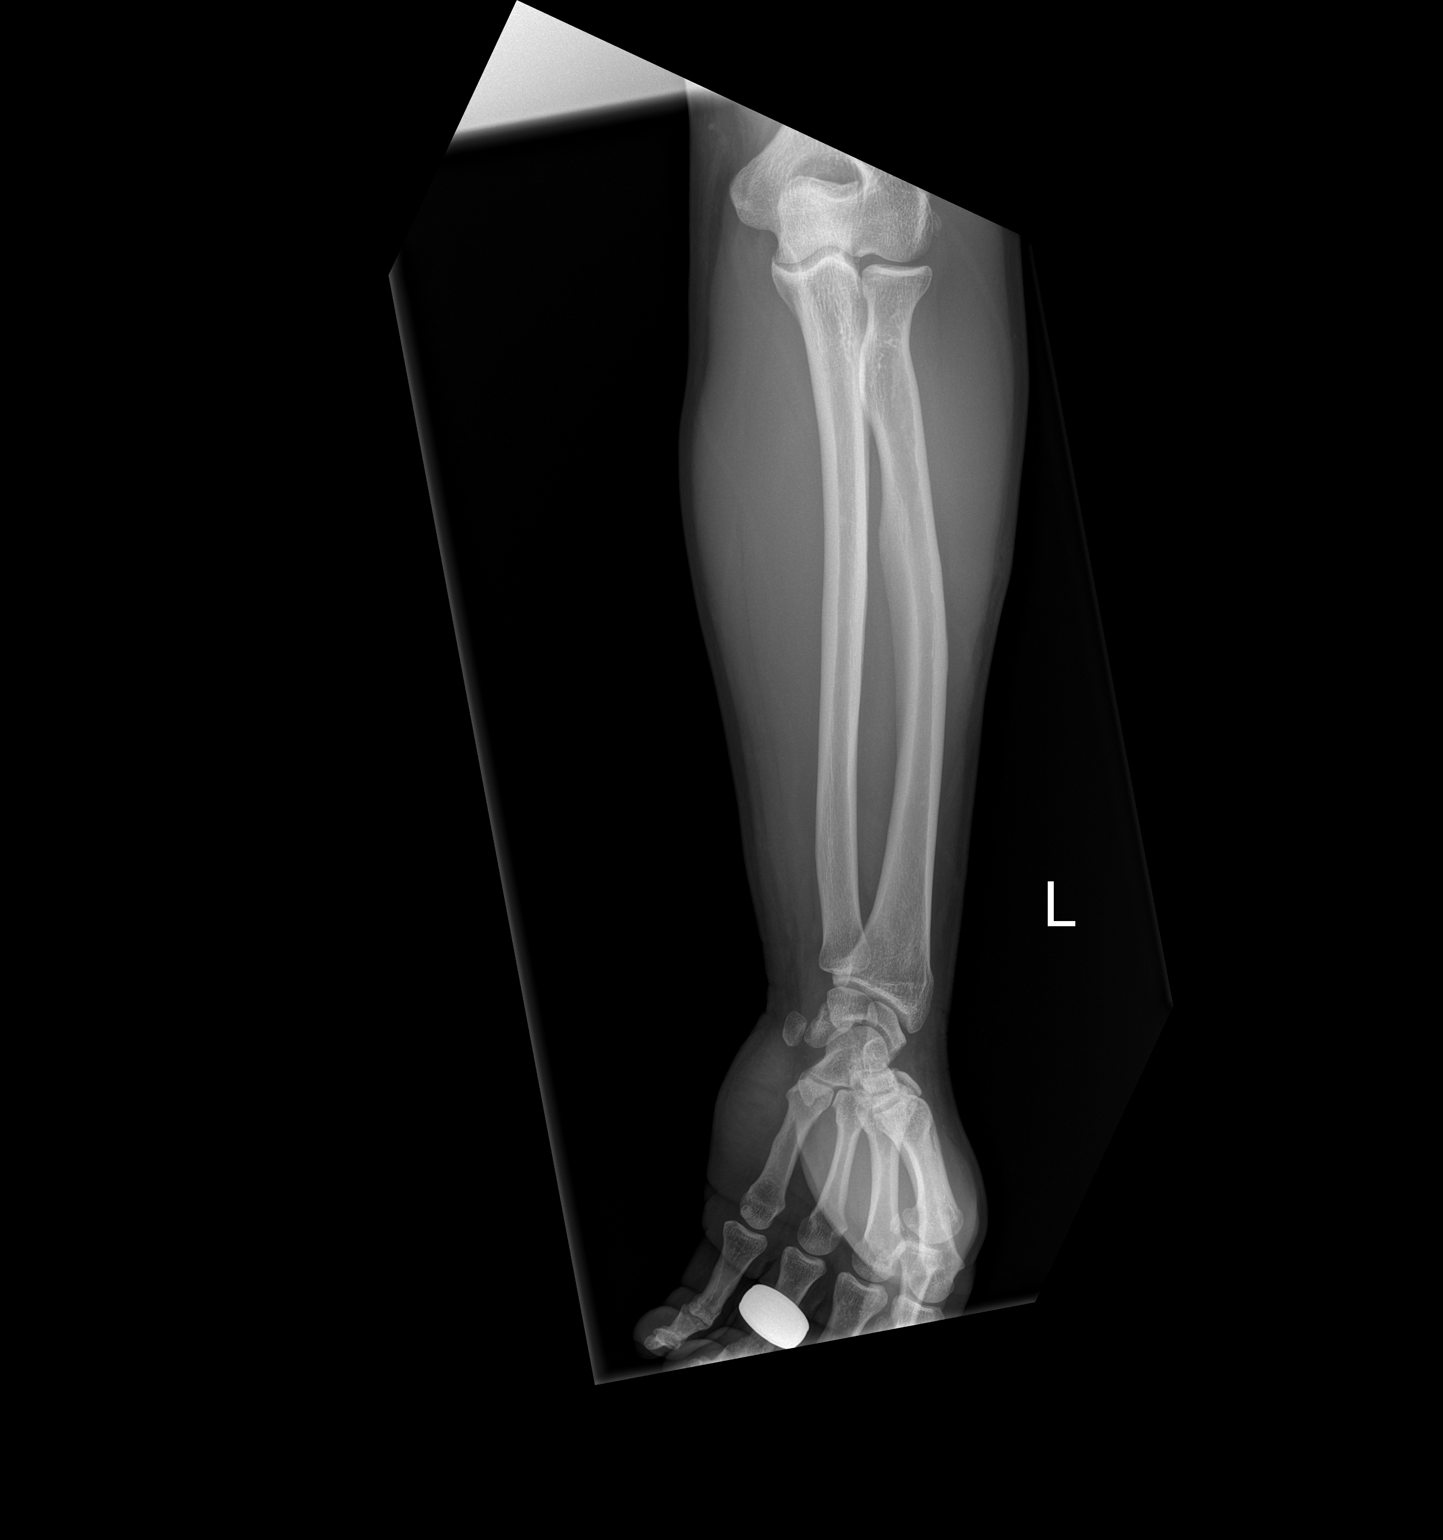

[x forearm lat left]
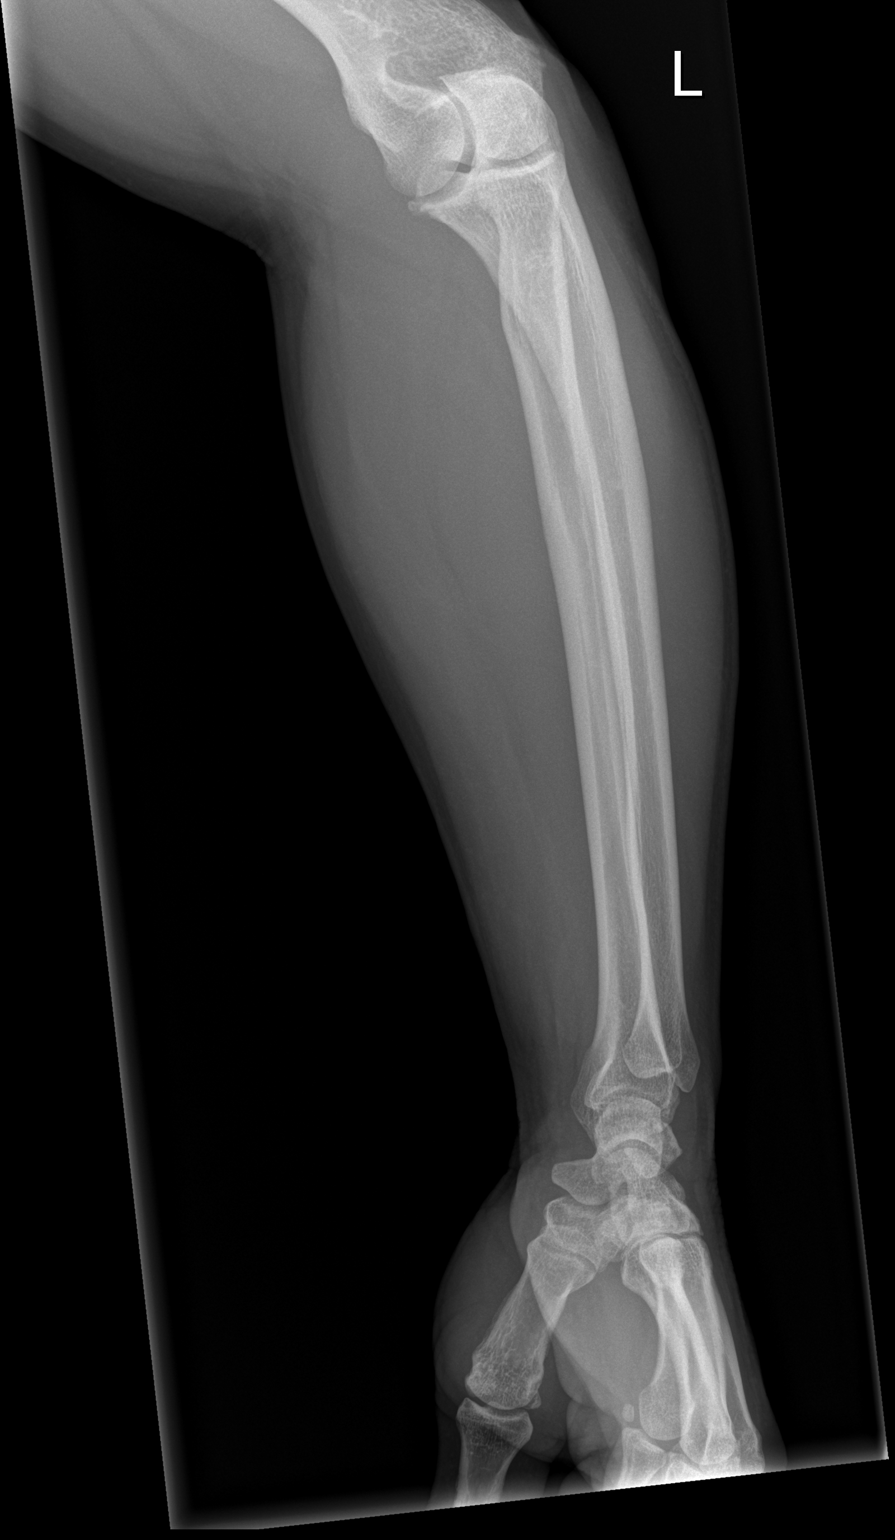

[2 of 2 positions shown; findings below may reference images not displayed]

FINDINGS: Frontal and lateral views were obtained. No evident radiopaque
foreign body or soft tissue air. No fracture or dislocation. No
abnormal periosteal reaction. Joint spaces appear normal. No
erosion. Minus ulnar variance noted.
IMPRESSION: No radiopaque foreign body or soft tissue air. No bony abnormality.
No arthropathy. There is a minus ulnar variance.

## 2023-11-05 ENCOUNTER — Other Ambulatory Visit: Payer: Self-pay | Admitting: Family Medicine

## 2023-11-05 DIAGNOSIS — F17210 Nicotine dependence, cigarettes, uncomplicated: Secondary | ICD-10-CM

## 2024-01-04 ENCOUNTER — Encounter: Payer: Self-pay | Admitting: Family Medicine

## 2024-01-21 ENCOUNTER — Encounter (HOSPITAL_BASED_OUTPATIENT_CLINIC_OR_DEPARTMENT_OTHER): Payer: Self-pay | Admitting: Internal Medicine

## 2024-01-21 DIAGNOSIS — R0683 Snoring: Secondary | ICD-10-CM

## 2024-01-21 DIAGNOSIS — R0681 Apnea, not elsewhere classified: Secondary | ICD-10-CM

## 2024-01-29 NOTE — Procedures (Signed)
 Orders only

## 2024-06-12 ENCOUNTER — Ambulatory Visit (HOSPITAL_BASED_OUTPATIENT_CLINIC_OR_DEPARTMENT_OTHER): Attending: Family Medicine | Admitting: Internal Medicine
